# Patient Record
Sex: Male | Born: 1980 | Hispanic: No | Marital: Married | Smoking: Never smoker
Health system: Southern US, Community
[De-identification: ages and names within clinical notes are randomized; demographics above are authoritative.]

## PROBLEM LIST (undated history)

## (undated) DIAGNOSIS — E7800 Pure hypercholesterolemia, unspecified: Secondary | ICD-10-CM

## (undated) HISTORY — PX: NO PAST SURGERIES: SHX2092

---

## 2016-03-10 ENCOUNTER — Ambulatory Visit
Admission: EM | Admit: 2016-03-10 | Discharge: 2016-03-10 | Discharge status: Against Medical Advice | Payer: Medicaid Other

## 2016-03-10 HISTORY — DX: Pure hypercholesterolemia, unspecified: E78.00

## 2016-03-10 NOTE — ED Triage Notes (Signed)
Patient complains of right swollen foot, he doesn't remember doing anything to it. But the foot is mildly swollen.

## 2017-05-18 ENCOUNTER — Ambulatory Visit: Payer: Medicaid Other

## 2017-05-18 ENCOUNTER — Ambulatory Visit
Admission: EM | Admit: 2017-05-18 | Discharge: 2017-05-18 | Disposition: A | Discharge status: Home / Self Care | Payer: Medicaid Other | Attending: Family Medicine | Admitting: Family Medicine

## 2017-05-18 DIAGNOSIS — S20212A Contusion of left front wall of thorax, initial encounter: Secondary | ICD-10-CM | POA: Insufficient documentation

## 2017-05-18 DIAGNOSIS — W1830XA Fall on same level, unspecified, initial encounter: Secondary | ICD-10-CM | POA: Insufficient documentation

## 2017-05-18 DIAGNOSIS — S42255A Nondisplaced fracture of greater tuberosity of left humerus, initial encounter for closed fracture: Secondary | ICD-10-CM | POA: Diagnosis not present

## 2017-05-18 DIAGNOSIS — W19XXXA Unspecified fall, initial encounter: Secondary | ICD-10-CM

## 2017-05-18 DIAGNOSIS — M25512 Pain in left shoulder: Secondary | ICD-10-CM | POA: Diagnosis present

## 2017-05-18 DIAGNOSIS — S20219A Contusion of unspecified front wall of thorax, initial encounter: Secondary | ICD-10-CM | POA: Diagnosis not present

## 2017-05-18 MED ORDER — OXYCODONE-ACETAMINOPHEN 5-325 MG PO TABS: 1.0000 | ORAL_TABLET | Freq: Three times a day (TID) | ORAL | 0 refills | Status: AC | PRN

## 2017-05-18 NOTE — ED Provider Notes (Signed)
[facility] ____________________________________________  Time seen: Approximately 8:50 PM  I have reviewed the triage vital signs and the nursing notes.   HISTORY  Chief Complaint Fall and Shoulder Pain (left)   HPI Dick Hark is a 36 y.o. male present family bedside for evaluation of left shoulder pain after an injury that occurred this afternoon. Patient reports that he had gone up a ladder 4 steps and accidentally misstepped causing him to fall. States that he fell directly on his left shoulder causing immediate onset of pain. Patient states that he did hit his left face, as well, denies loss of consciousness, headaches, vision changes or other complaints. States pain is to left shoulder. States does feel some pain down to left forearm. States unable to left shoulder up. Reports right hand dominant. Denies paresthesias pain radiation. Denies any neck or back pain, abdominal pain, chest pain, shortness of breath, hemoptysis, lower extremity pain. Reports has continued to remain active. Reports otherwise feels well. No alleviating measures attempted to prior to arrival. States pain is primarily with direct palpation or active range of motion. States mild pain at this time, moderate to severe with range of motion. Denies history of similar in the past. States no pain prior to fall. Reports otherwise feels well.Denies chest pain, chest pain with deep breath, shortness of breath, abdominal pain, or rash. Denies recent sickness. Denies recent antibiotic use. Denies chronic medication problems.    Past Medical History:  Diagnosis Date  . High cholesterol     There are no active problems to display for this patient.   Past Surgical History:  Procedure Laterality Date  . NO PAST SURGERIES       No current facility-administered medications for this encounter.   Current Outpatient Prescriptions:  .  oxyCODONE-acetaminophen (ROXICET) 5-325 MG tablet, Take 1 tablet by mouth  every 8 (eight) hours as needed for moderate pain or severe pain (Do not drive or operate heavy machinery while taking as can cause drowsiness.)., Disp: 9 tablet, Rfl: 0  Allergies Patient has no known allergies.  Family History  Problem Relation Age of Onset  . Heart failure Father     Social History Social History  Substance Use Topics  . Smoking status: Never Smoker  . Smokeless tobacco: Never Used  . Alcohol use No    Review of Systems Constitutional: No fever/chills Eyes: No visual changes. Cardiovascular: Denies chest pain. Respiratory: Denies shortness of breath. Gastrointestinal: No abdominal pain.   Genitourinary: Negative for dysuria. Musculoskeletal: Negative for back pain. AS above. Skin: Negative for rash. Neurological: Negative for headaches, focal weakness or numbness.  .  ____________________________________________   PHYSICAL EXAM:  VITAL SIGNS: ED Triage Vitals  Enc Vitals Group     BP 05/18/17 1847 (!) 157/94     Pulse Rate 05/18/17 1847 (!) 113 Recheck 90     Resp 05/18/17 1847 18     Temp 05/18/17 1847 98.6 F (37 C)     Temp Source 05/18/17 1847 Oral     SpO2 05/18/17 1847 99 %     Weight 05/18/17 1847 280 lb (127 kg)     Height 05/18/17 1847  (1.753 m)     Head Circumference --      Peak Flow --      Pain Score 05/18/17 1846 7     Pain Loc --      Pain Edu? --      Excl. in GC? --     Constitutional: Alert  and oriented. Well appearing and in no acute distress. Eyes: Conjunctivae are normal.  ENT      Head: Normocephalic and atraumatic.Nontender to palpation.      Nose: No congestion/rhinnorhea. Nontender to palpation.      Mouth/Throat: Mucous membranes are moist.Oropharynx non-erythematous. Neck: No stridor. Supple without meningismus.  Hematological/Lymphatic/Immunilogical: No cervical lymphadenopathy. Cardiovascular: Normal rate, regular rhythm. Grossly normal heart sounds.  Good peripheral circulation. Respiratory:  Normal respiratory effort without tachypnea nor retractions. Breath sounds are clear and equal bilaterally. No wheezes, rales, rhonchi. Gastrointestinal: Soft and nontender. Obese abdomen. Musculoskeletal: No midline cervical, thoracic or lumbar tenderness to palpation.  Except: Left proximal humerus and lateral shoulder mild to moderate tenderness to direct palpation, no ecchymosis,  unable to abduct more than 15, left upper extremity otherwise nontender, bilateral hand grips strong and equal, bilateral distal radial pulses equal and easily palpated, no clavicular tenderness, left anterior upper lateral chest along lateral ribs mild tenderness to direct palpation, no palpable rib fracture, no ecchymosis, no other torso tenderness noted. Neurologic:  Normal speech and language. No gross focal neurologic deficits are appreciated. Speech is normal. No gait instability.  Skin:  Skin is warm, dry and intact. No rash noted. Psychiatric: Mood and affect are normal. Speech and behavior are normal. Patient exhibits appropriate insight and judgment   ___________________________________________   LABS (all labs ordered are listed, but only abnormal results are displayed)  Labs Reviewed - No data to display _________________________________________  RADIOLOGY  Dg Ribs Unilateral W/chest Left  Result Date: 05/18/2017 CLINICAL DATA:  36 year old male with history of trauma from a fall today, with chest pain. EXAM: LEFT RIBS AND CHEST - 3+ VIEW COMPARISON:  No priors. FINDINGS: Lung volumes are normal. No consolidative airspace disease. No pleural effusions. No pneumothorax. No pulmonary nodule or mass noted. Pulmonary vasculature and the cardiomediastinal silhouette are within normal limits. Dedicated views of both left and right ribs demonstrate no acute displaced rib fractures. IMPRESSION: 1. No acute displaced rib fractures. 2. No pneumothorax or other findings to suggest significant acute traumatic  injury to the thorax. Electronically Signed   By: Trudie Reed M.D.   On: 05/18/2017 19:51   Dg Shoulder Left  Result Date: 05/18/2017 CLINICAL DATA:  Acute left shoulder pain after fall today. EXAM: LEFT SHOULDER - 2+ VIEW COMPARISON:  None. FINDINGS: Probable nondisplaced fracture is seen involving greater tuberosity of proximal left humerus. No other bony abnormality or dislocation is noted. Joint spaces appear intact. Visualized ribs are unremarkable. IMPRESSION: Probable nondisplaced fracture involving greater tuberosity of proximal left humerus. Electronically Signed   By: Lupita Raider, M.D.   On: 05/18/2017 19:52   Dg Humerus Left  Result Date: 05/18/2017 CLINICAL DATA:  Left shoulder pain after fall today. EXAM: LEFT HUMERUS - 2+ VIEW COMPARISON:  None. FINDINGS: There appears to be a minimally displaced fracture involving the greater tuberosity of the proximal left humerus. No other bony abnormality is noted. No soft tissue abnormality is noted. IMPRESSION: Probable minimally displaced fracture involving greater tuberosity of proximal left humerus. Electronically Signed   By: Lupita Raider, M.D.   On: 05/18/2017 19:50   ____________________________________________   PROCEDURES Procedures   Placed in sling.  INITIAL IMPRESSION / ASSESSMENT AND PLAN / ED COURSE  Pertinent labs & imaging results that were available during my care of the patient were reviewed by me and considered in my medical decision making (see chart for details).  Well-appearing patient. No acute distress. Presents for  evaluation of left shoulder pain post mechanical injury that occurred earlier this afternoon. No focal neurological deficits. Point proximal humerus tenderness. X-rays reviewed, probable nondisplaced fracture involving greater tuberosity of proximal left humerus. Left ribs no acute displaced rib fracture, no pneumothorax or other findings. Discussed with patient  place in sling, keep in sling.  Apply ice, rest and elevate. Follow-up with orthopedic in 3-4 days, information given. Over-the-counter ibuprofen as needed. Rx given for quantity 9 Percocet as needed for breakthrough pain. [place] controlled substance database reviewed, no narcotic prescriptions found.Discussed indication, risks and benefits of medications with patient.  Discussed follow up with Primary care physician this week as needed. Discussed follow up and return parameters including no resolution or any worsening concerns. Patient verbalized understanding and agreed to plan.   ____________________________________________   FINAL CLINICAL IMPRESSION(S) / ED DIAGNOSES  Final diagnoses:  Nondisplaced fracture of greater tuberosity of left humerus, initial encounter for closed fracture  Contusion of left chest wall, initial encounter     There are no discharge medications for this patient.   Note: This dictation was prepared with Dragon dictation along with smaller phrase technology. Any transcriptional errors that result from this process are unintentional.         Renford Dills, NP 05/18/17 2104

## 2017-05-18 NOTE — Discharge Instructions (Signed)
Take medication as prescribed. Rest. Ice. Keep in sling.   Follow up with orthopedic this week. See above.   Follow up with your primary care physician this week as needed. Return to Urgent care for new or worsening concerns.

## 2017-05-18 NOTE — ED Triage Notes (Signed)
Patient complains of fall that occurred around 12pm today while at [employer]. Patient states that he was approx 4 rungs up and fell onto the rug/carpet. Patient is now complaining of left shoulder pain that radiates down to mid forearm.

## 2017-07-29 ENCOUNTER — Other Ambulatory Visit: Payer: Self-pay | Admitting: Orthopedic Surgery

## 2017-07-29 DIAGNOSIS — M25312 Other instability, left shoulder: Secondary | ICD-10-CM

## 2017-07-29 DIAGNOSIS — S46002D Unspecified injury of muscle(s) and tendon(s) of the rotator cuff of left shoulder, subsequent encounter: Secondary | ICD-10-CM

## 2018-10-19 IMAGING — CR DG HUMERUS 2V *L*
3 series · 3 of 3 positions shown · non-contrast
Comparison: None.

CLINICAL DATA: Left shoulder pain after fall today.

EXAM:
LEFT HUMERUS - 2+ VIEW

[humerus ap (1 of 2)]
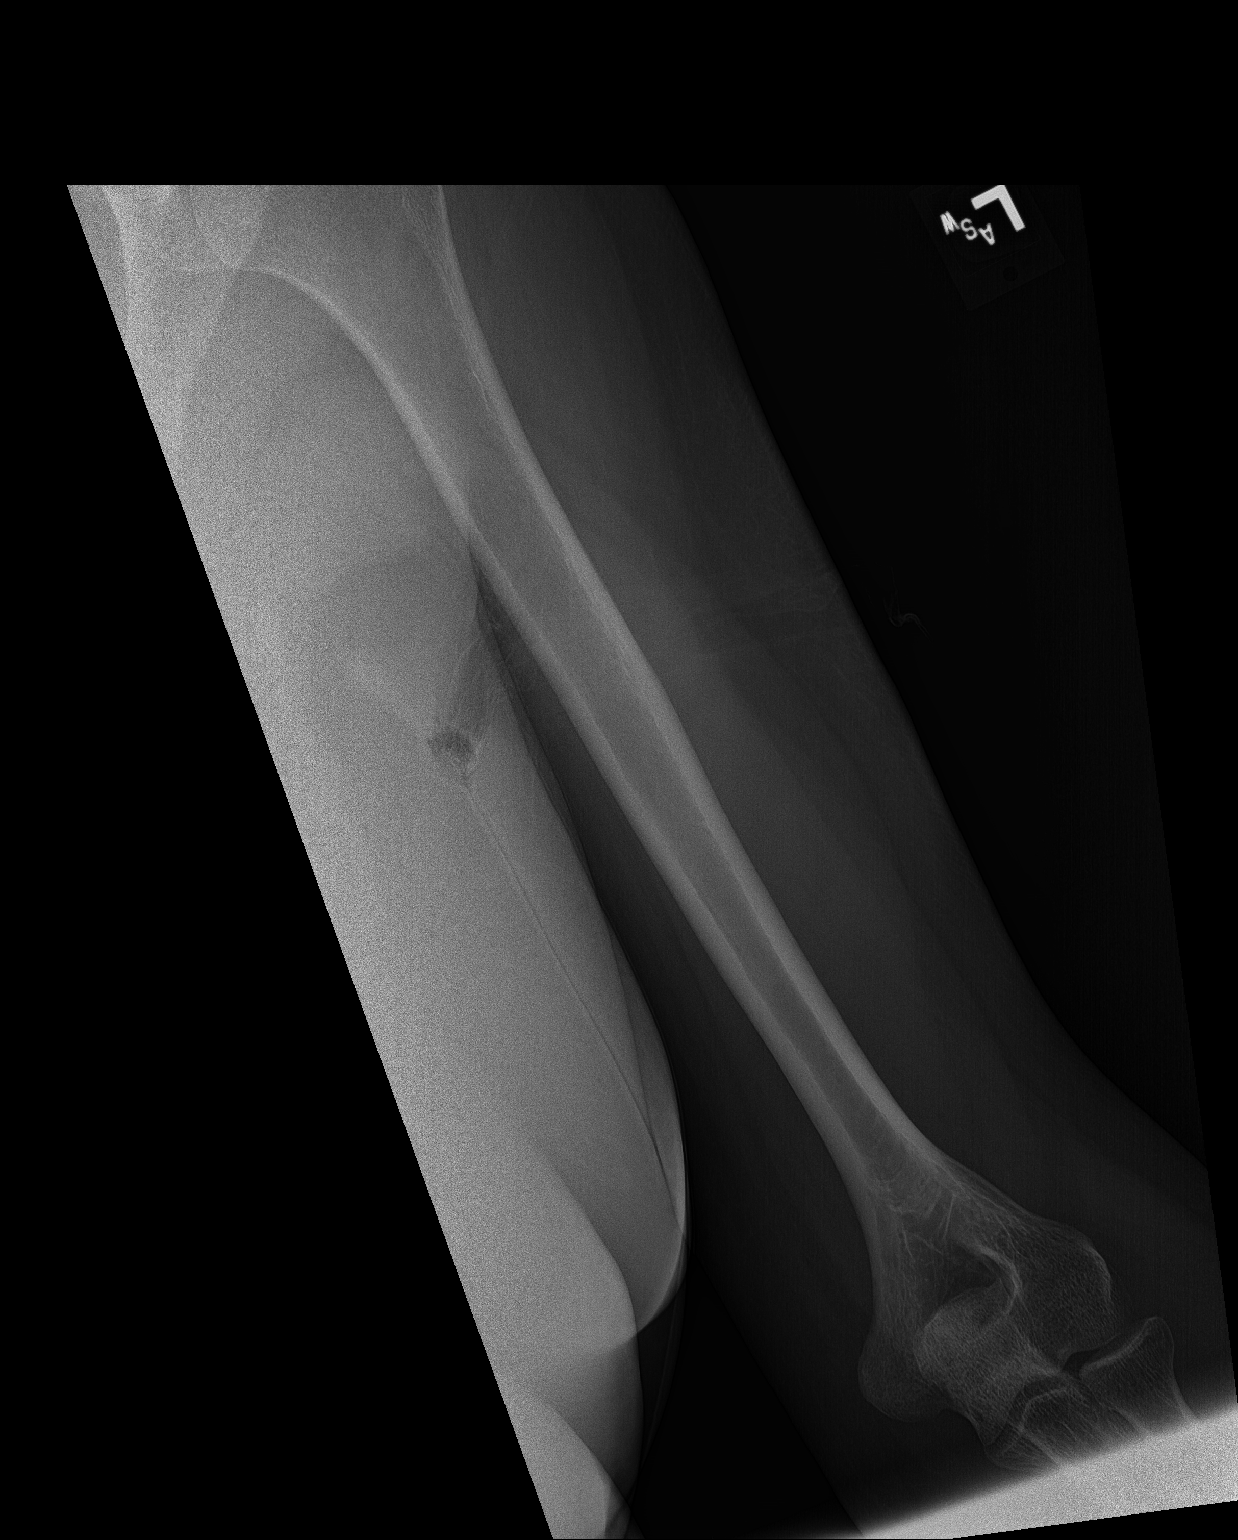

[humerus ap (2 of 2)]
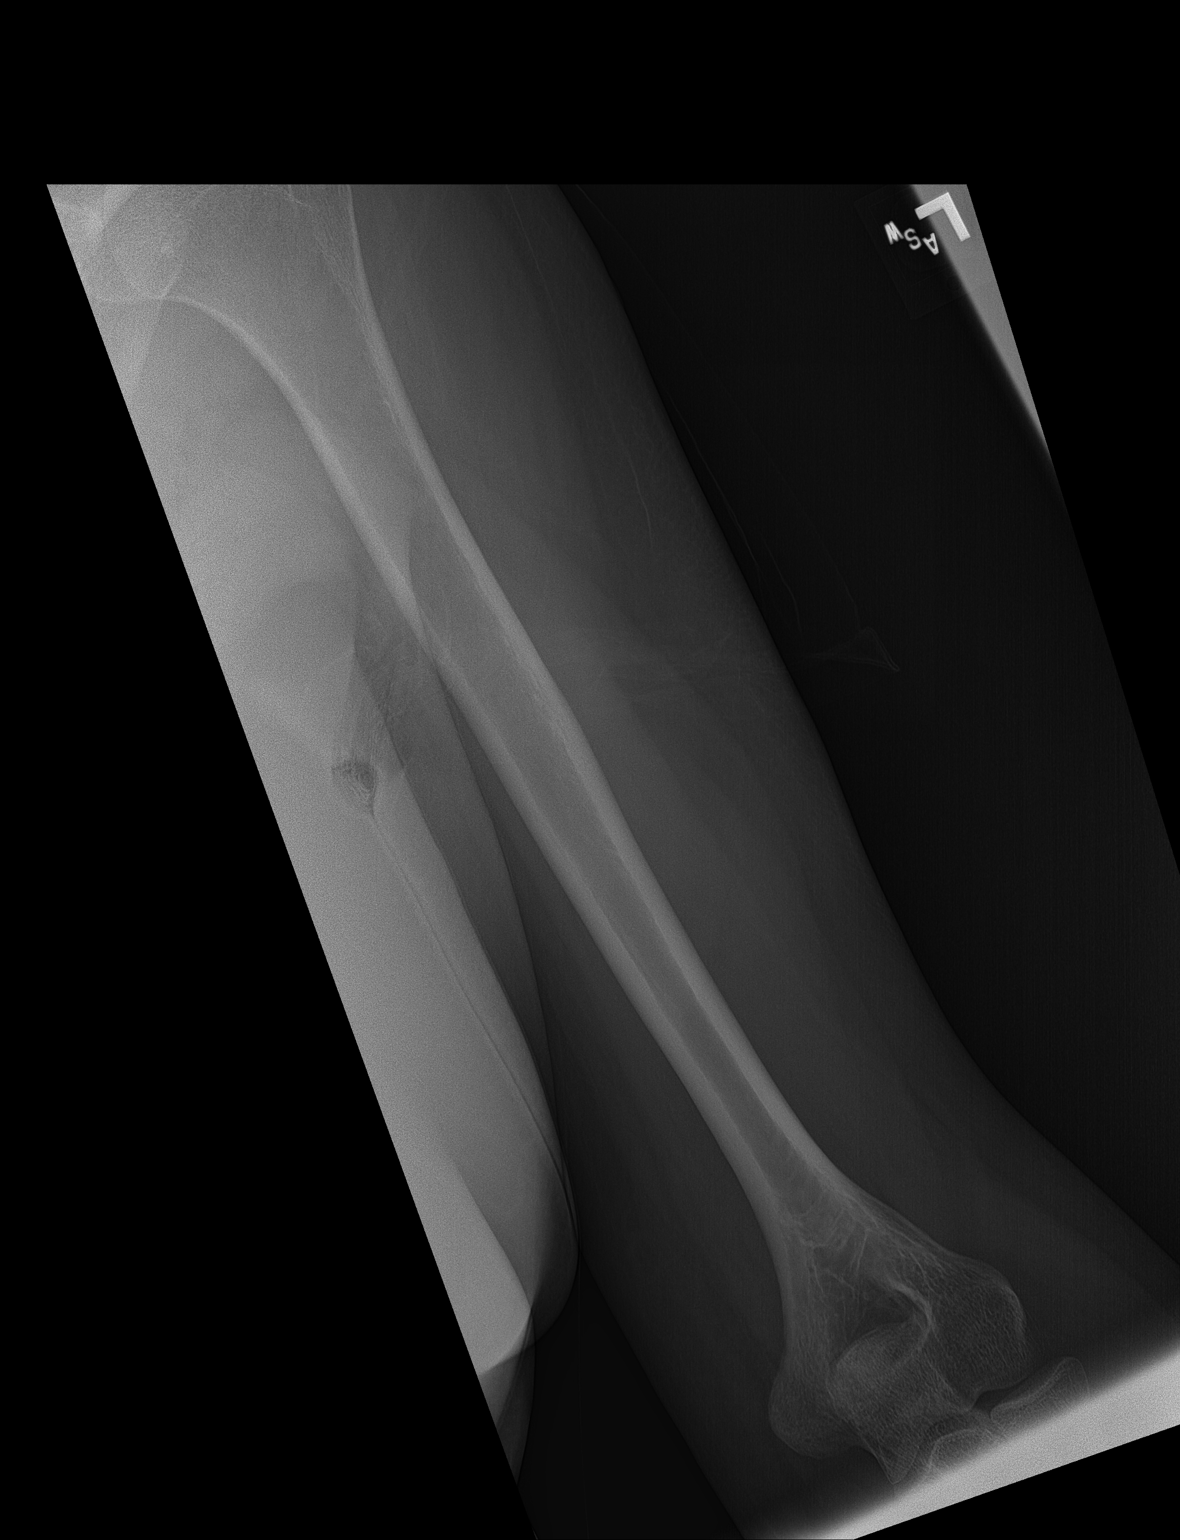

[humerus lat]
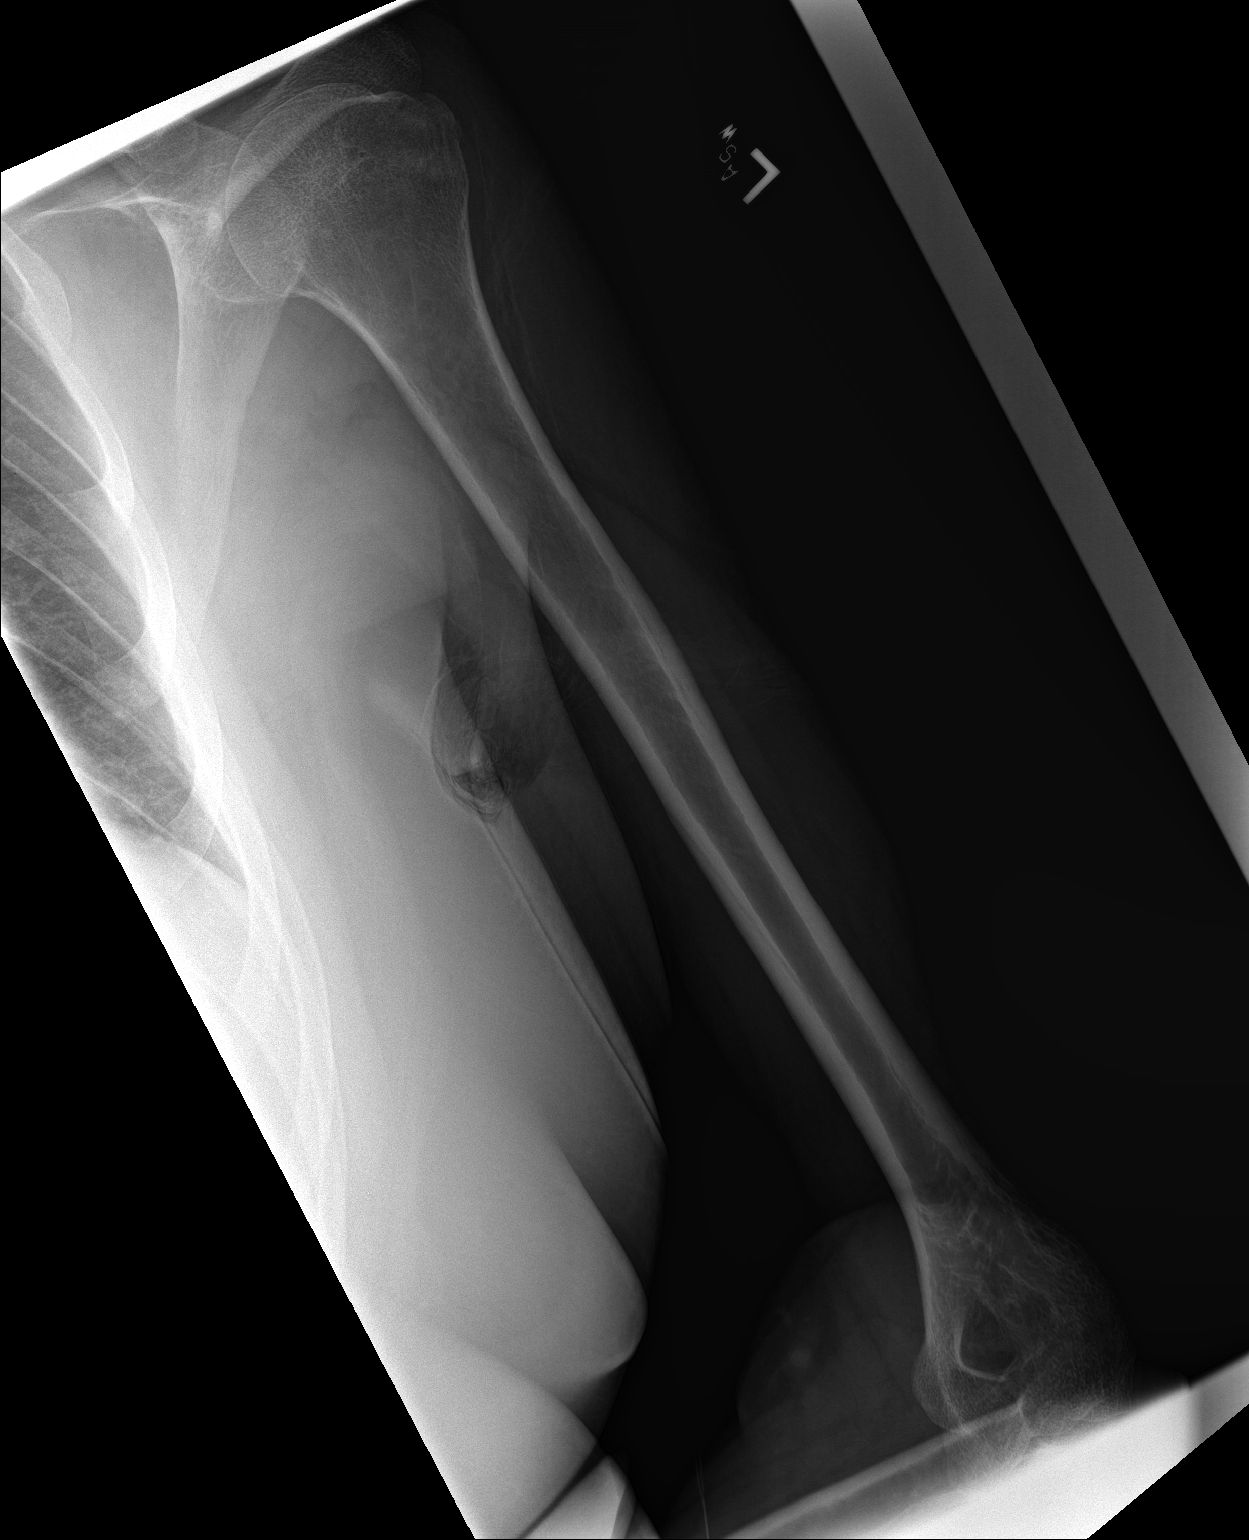

[3 of 3 positions shown; findings below may reference images not displayed]

FINDINGS: There appears to be a minimally displaced fracture involving the
greater tuberosity of the proximal left humerus. No other bony
abnormality is noted. No soft tissue abnormality is noted.
IMPRESSION: Probable minimally displaced fracture involving greater tuberosity
of proximal left humerus.

## 2018-10-19 IMAGING — CR DG RIBS W/ CHEST 3+V*L*
6 series · 6 of 6 positions shown · non-contrast
Comparison: No priors.

CLINICAL DATA: 36-year-old male with history of trauma from a fall
today, with chest pain.

EXAM:
LEFT RIBS AND CHEST - 3+ VIEW

[chest pa]
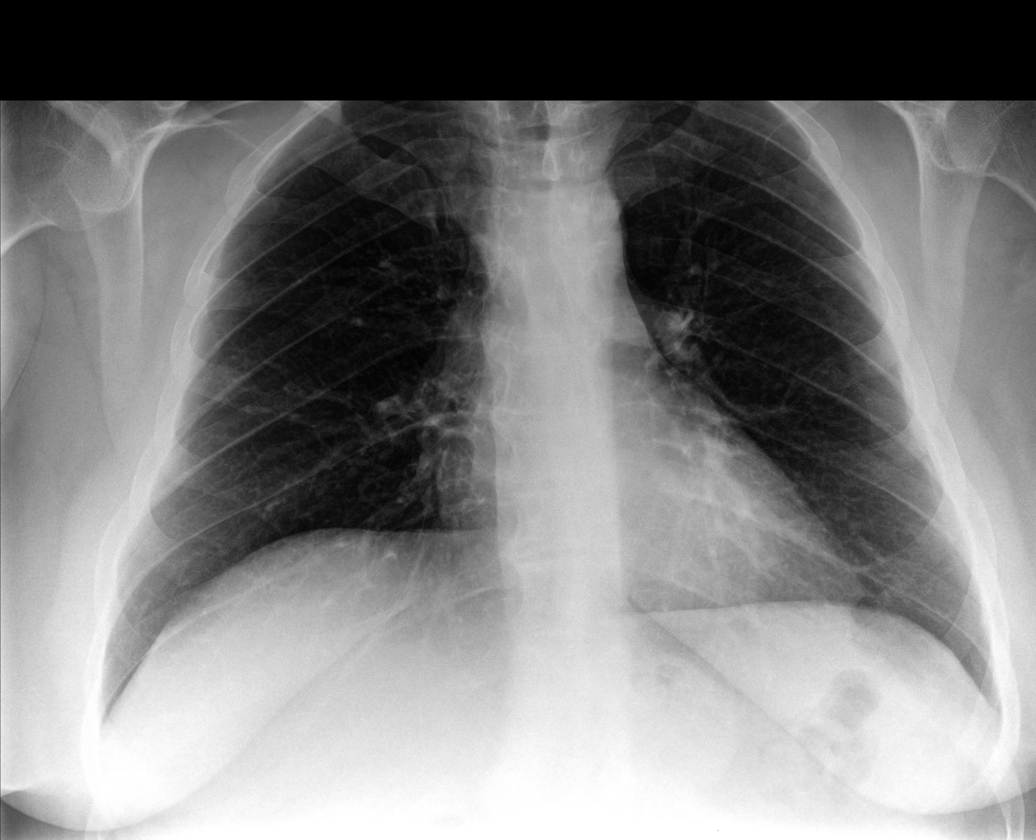

[rib pa (1 of 2)]
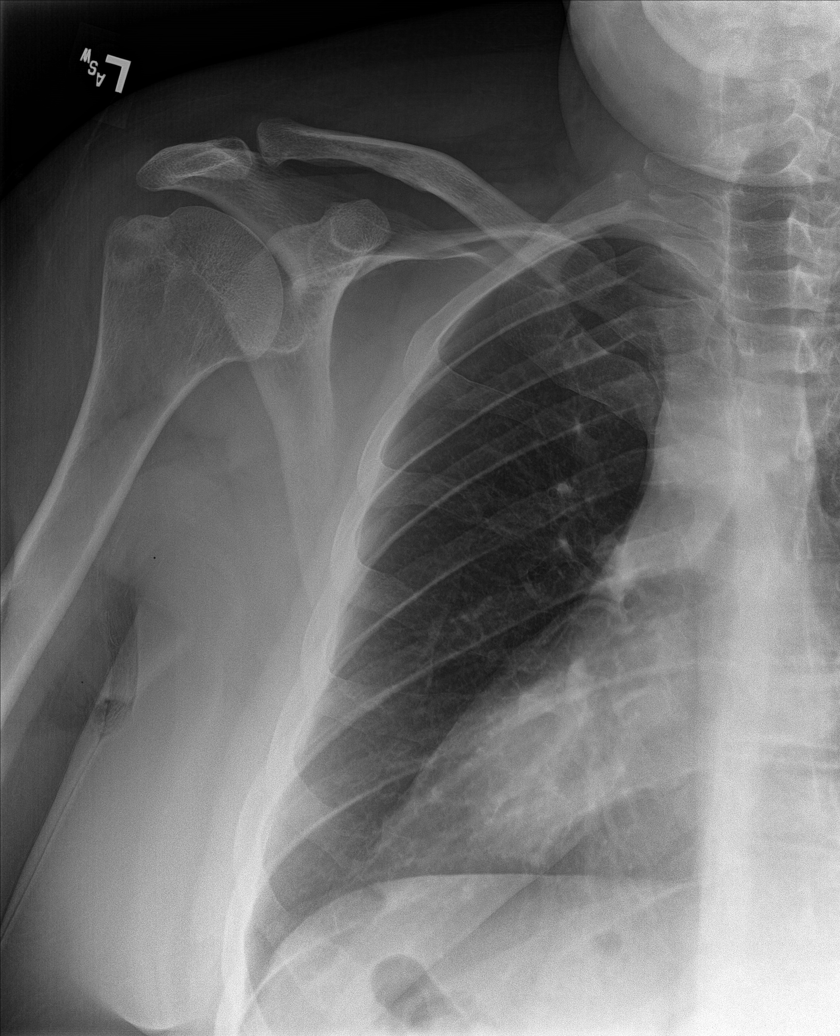

[rib pa (2 of 2)]
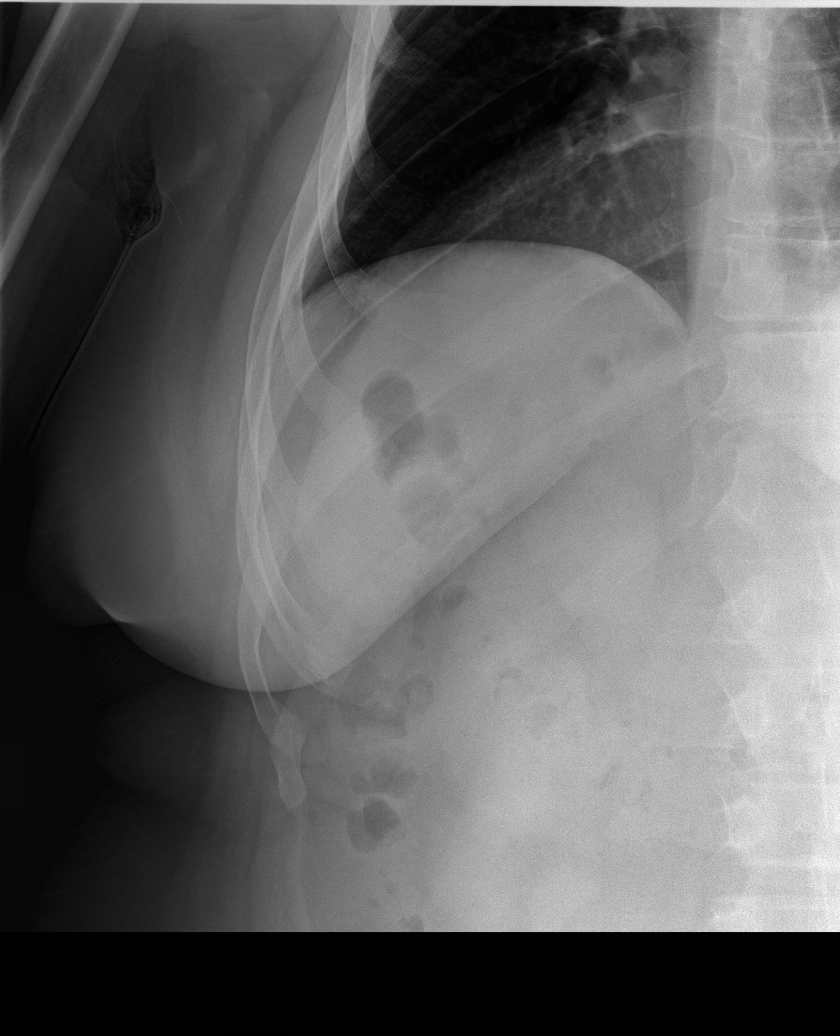

[rib obl (1 of 3)]
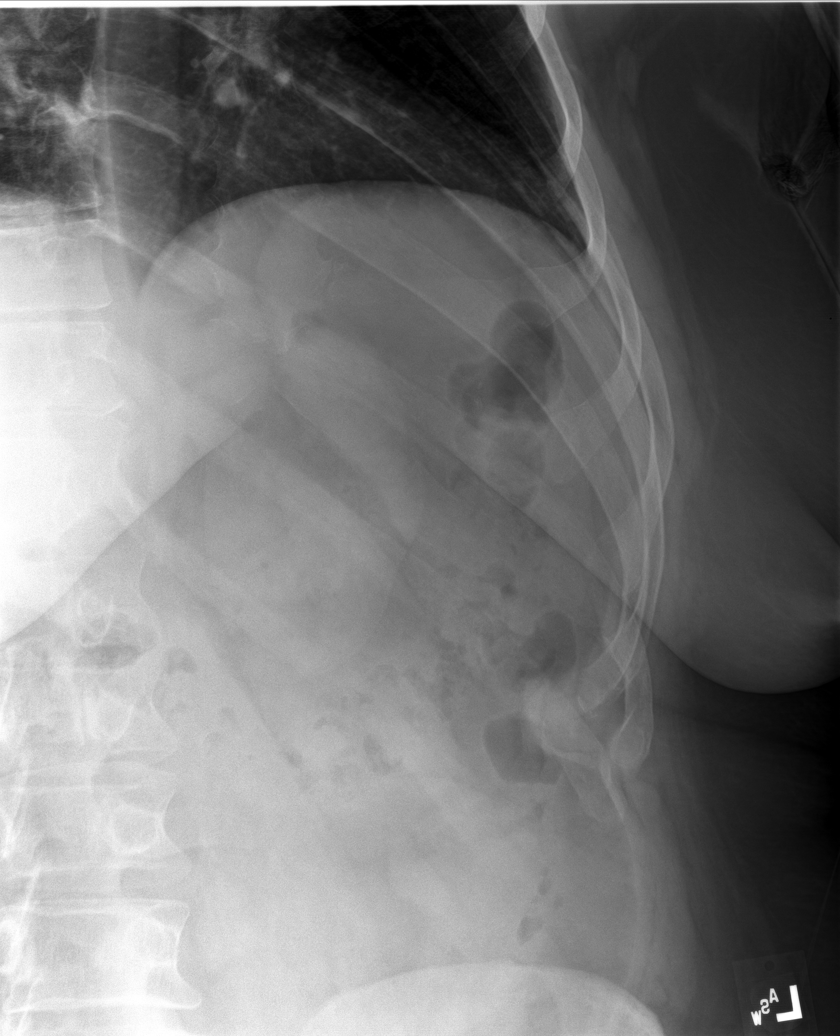

[rib obl (2 of 3)]
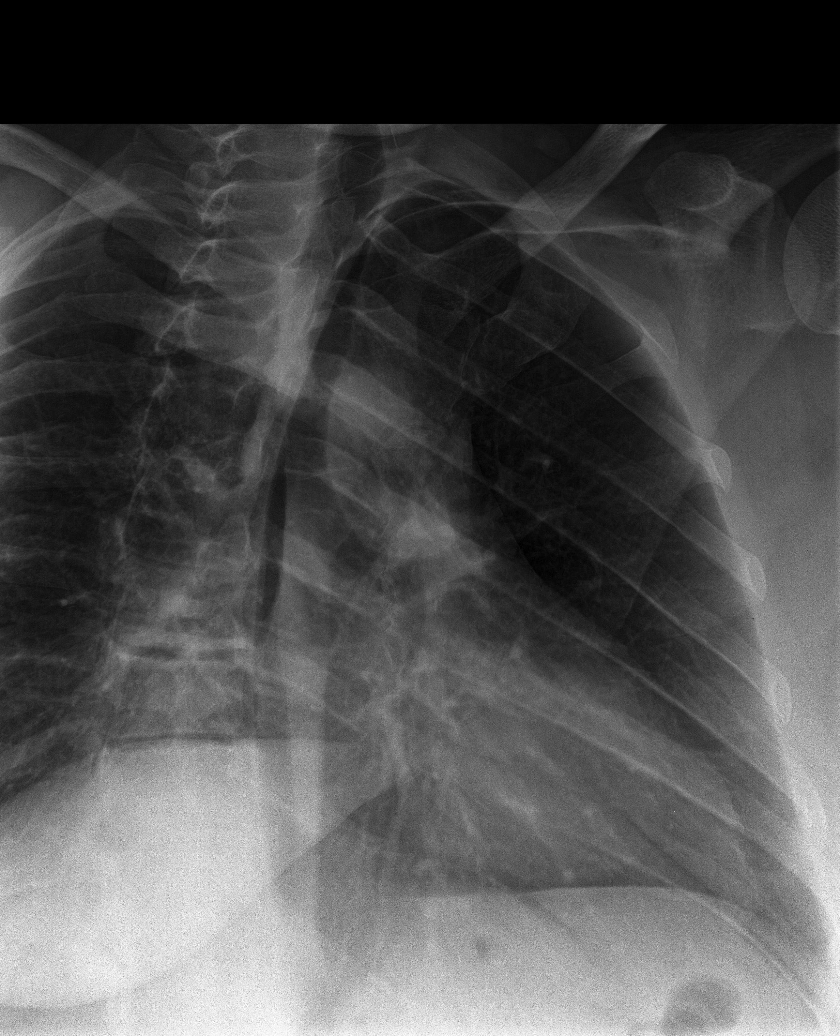

[rib obl (3 of 3)]
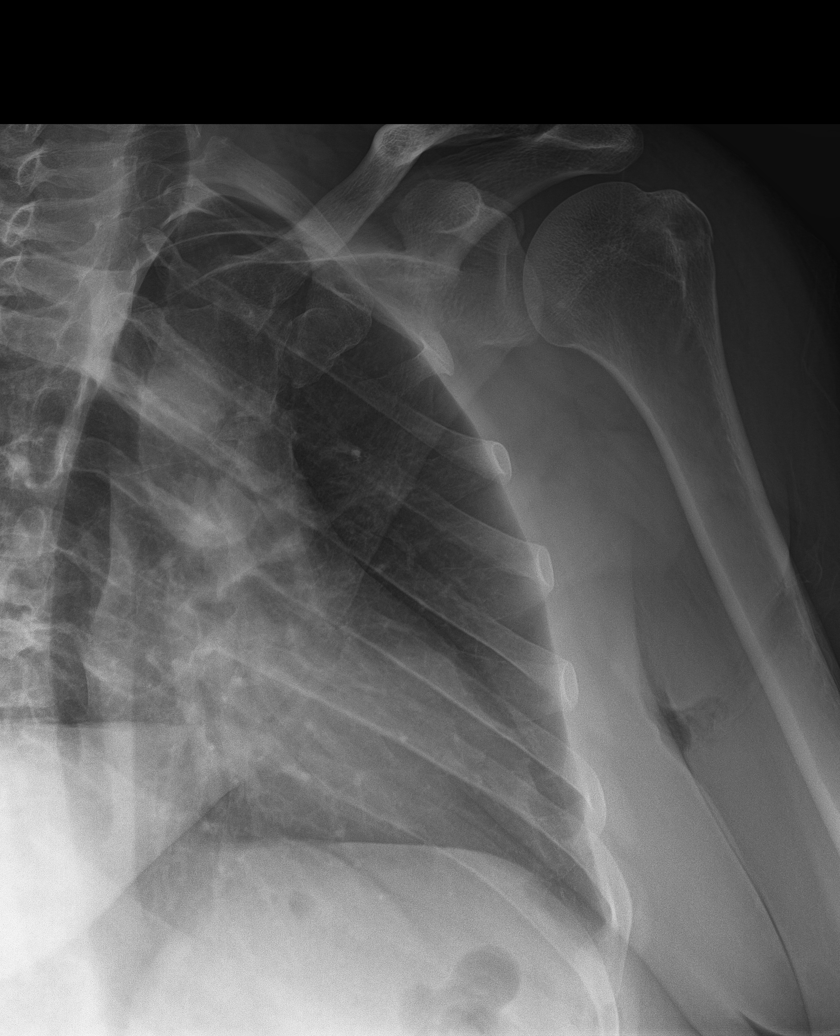

[6 of 6 positions shown; findings below may reference images not displayed]

FINDINGS: Lung volumes are normal. No consolidative airspace disease. No
pleural effusions. No pneumothorax. No pulmonary nodule or mass
noted. Pulmonary vasculature and the cardiomediastinal silhouette
are within normal limits.

Dedicated views of both left and right ribs demonstrate no acute
displaced rib fractures.
IMPRESSION: 1. No acute displaced rib fractures.
2. No pneumothorax or other findings to suggest significant acute
traumatic injury to the thorax.

## 2018-10-19 IMAGING — CR DG SHOULDER 2+V*L*
4 series · 4 of 4 positions shown · non-contrast
Comparison: None.

CLINICAL DATA: Acute left shoulder pain after fall today.

EXAM:
LEFT SHOULDER - 2+ VIEW

[shoulder grashey (1 of 2)]
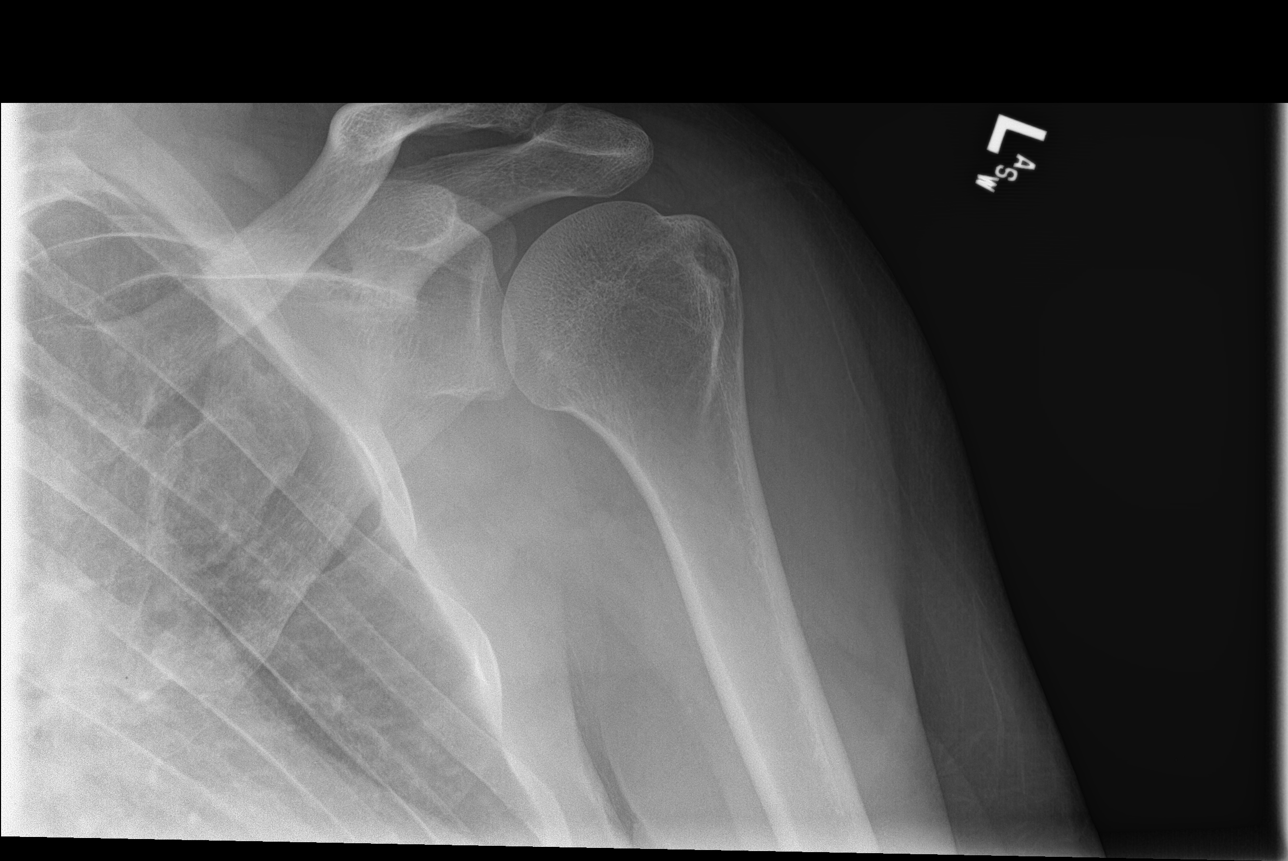

[shoulder y view]
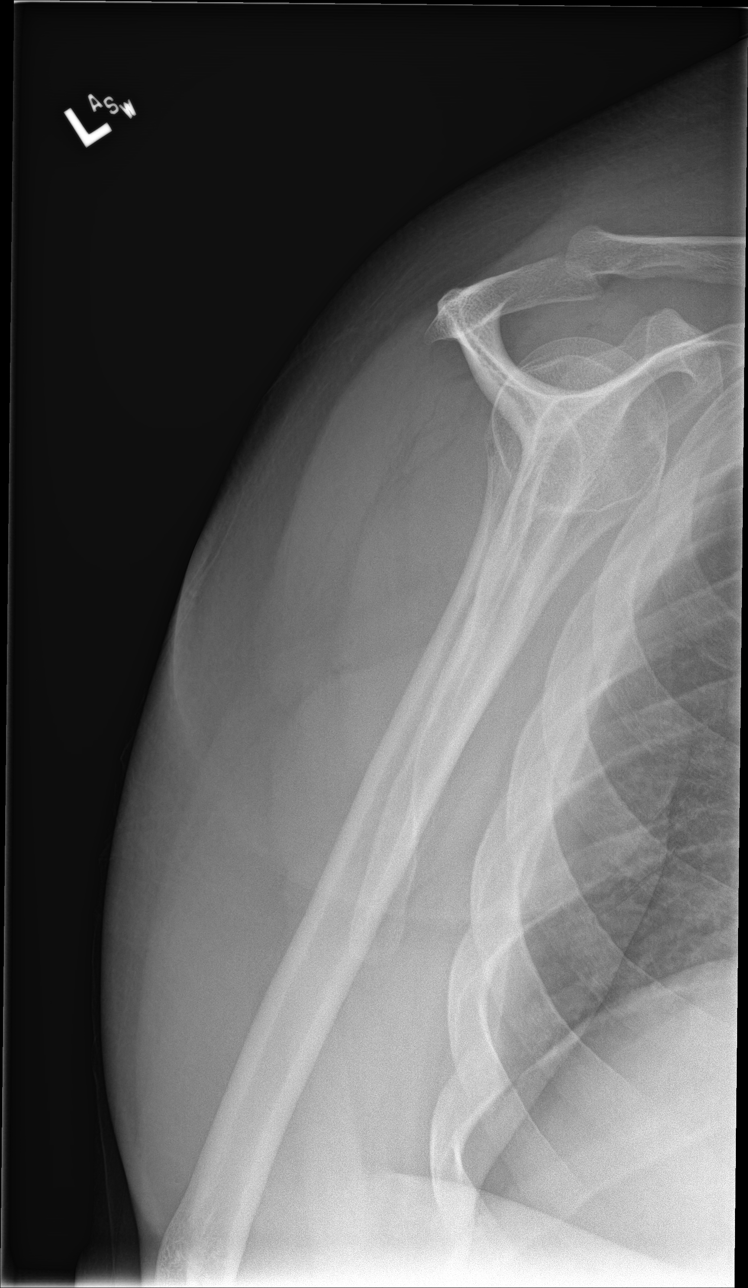

[shoulder grashey (2 of 2)]
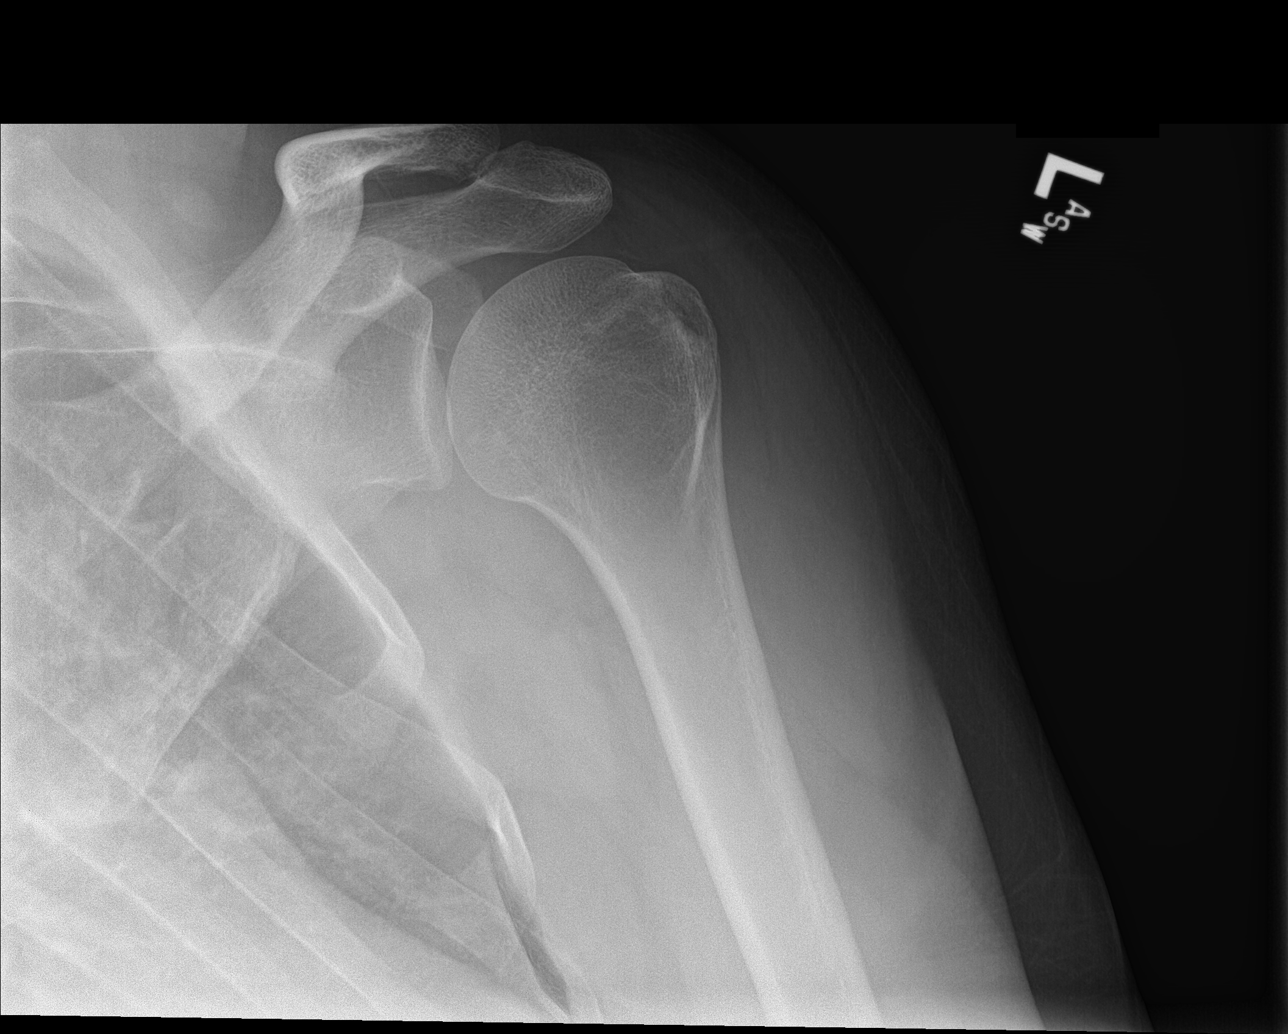

[shoulder axial]
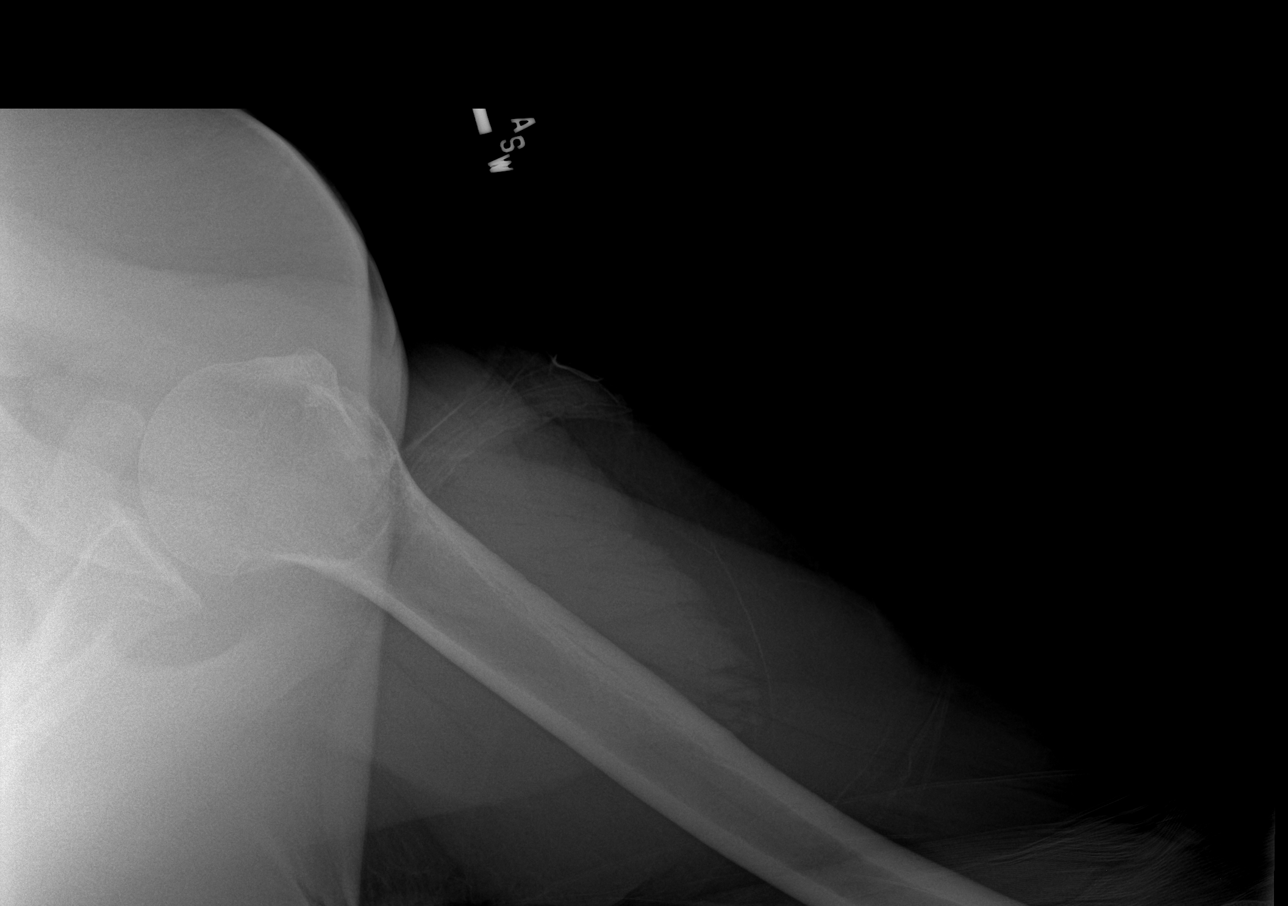

[4 of 4 positions shown; findings below may reference images not displayed]

FINDINGS: Probable nondisplaced fracture is seen involving greater tuberosity
of proximal left humerus. No other bony abnormality or dislocation
is noted. Joint spaces appear intact. Visualized ribs are
unremarkable.
IMPRESSION: Probable nondisplaced fracture involving greater tuberosity of
proximal left humerus.
